# Patient Record
Sex: Male | Born: 1975 | Race: White | Hispanic: No | Smoking: Former smoker
Health system: Southern US, Community
[De-identification: ages and names within clinical notes are randomized; demographics above are authoritative.]

## PROBLEM LIST (undated history)

## (undated) DIAGNOSIS — M1990 Unspecified osteoarthritis, unspecified site: Secondary | ICD-10-CM

## (undated) DIAGNOSIS — R112 Nausea with vomiting, unspecified: Secondary | ICD-10-CM

## (undated) DIAGNOSIS — Z9889 Other specified postprocedural states: Secondary | ICD-10-CM

## (undated) DIAGNOSIS — S42022K Displaced fracture of shaft of left clavicle, subsequent encounter for fracture with nonunion: Secondary | ICD-10-CM

## (undated) HISTORY — PX: HAND SURGERY: SHX662

## (undated) HISTORY — PX: SHOULDER SURGERY: SHX246

---

## 2001-09-22 ENCOUNTER — Ambulatory Visit (HOSPITAL_COMMUNITY): Admission: RE | Admit: 2001-09-22 | Discharge: 2001-09-22 | Payer: Self-pay | Admitting: Orthopedic Surgery

## 2001-09-22 ENCOUNTER — Encounter: Payer: Self-pay | Admitting: Orthopedic Surgery

## 2001-12-03 ENCOUNTER — Emergency Department (HOSPITAL_COMMUNITY): Admission: EM | Admit: 2001-12-03 | Discharge: 2001-12-03 | Payer: Self-pay | Admitting: *Deleted

## 2011-01-03 ENCOUNTER — Other Ambulatory Visit: Payer: Self-pay | Admitting: Orthopedic Surgery

## 2011-01-03 DIAGNOSIS — M25551 Pain in right hip: Secondary | ICD-10-CM

## 2011-01-03 DIAGNOSIS — M545 Low back pain: Secondary | ICD-10-CM

## 2011-01-08 ENCOUNTER — Other Ambulatory Visit: Payer: Self-pay

## 2011-01-09 ENCOUNTER — Other Ambulatory Visit: Payer: Self-pay

## 2011-06-10 ENCOUNTER — Emergency Department (INDEPENDENT_AMBULATORY_CARE_PROVIDER_SITE_OTHER)
Admission: EM | Admit: 2011-06-10 | Discharge: 2011-06-10 | Disposition: A | Discharge status: Home / Self Care | Payer: BC Managed Care – PPO | Source: Home / Self Care | Attending: Emergency Medicine | Admitting: Emergency Medicine

## 2011-06-10 DIAGNOSIS — S61419A Laceration without foreign body of unspecified hand, initial encounter: Secondary | ICD-10-CM

## 2011-06-10 DIAGNOSIS — IMO0002 Reserved for concepts with insufficient information to code with codable children: Secondary | ICD-10-CM

## 2011-06-10 DIAGNOSIS — Z23 Encounter for immunization: Secondary | ICD-10-CM

## 2011-06-10 DIAGNOSIS — T148XXA Other injury of unspecified body region, initial encounter: Secondary | ICD-10-CM

## 2011-06-10 DIAGNOSIS — S61409A Unspecified open wound of unspecified hand, initial encounter: Secondary | ICD-10-CM

## 2011-06-10 MED ORDER — HYDROCODONE-ACETAMINOPHEN 5-325 MG PO TABS
1.0000 | ORAL_TABLET | Freq: Once | ORAL | Status: AC
  Administered 2011-06-10: 1 via ORAL

## 2011-06-10 MED ORDER — HYDROCODONE-ACETAMINOPHEN 5-325 MG PO TABS
ORAL_TABLET | ORAL | Status: AC
  Filled 2011-06-10: qty 1

## 2011-06-10 MED ORDER — TETANUS-DIPHTH-ACELL PERTUSSIS 5-2.5-18.5 LF-MCG/0.5 IM SUSP
INTRAMUSCULAR | Status: AC
  Filled 2011-06-10: qty 0.5

## 2011-06-10 MED ORDER — OXYCODONE-ACETAMINOPHEN 5-325 MG PO TABS: ORAL_TABLET | ORAL | Status: AC

## 2011-06-10 MED ORDER — TETANUS-DIPHTH-ACELL PERTUSSIS 5-2.5-18.5 LF-MCG/0.5 IM SUSP
0.5000 mL | Freq: Once | INTRAMUSCULAR | Status: AC
  Administered 2011-06-10: 0.5 mL via INTRAMUSCULAR

## 2011-06-10 MED ORDER — CEPHALEXIN 500 MG PO CAPS: 500.0000 mg | ORAL_CAPSULE | Freq: Three times a day (TID) | ORAL | Status: AC

## 2011-06-10 NOTE — ED Provider Notes (Signed)
History     CSN: [id number]  Arrival date & time 06/10/11  4403   First MD Initiated Contact with Patient 06/10/11 1837      Chief Complaint  Patient presents with  . Extremity Laceration    (Consider location/radiation/quality/duration/timing/severity/associated sxs/prior treatment) HPI Comments: Geoffrey Perez is a 36 year old male who lacerated the dorsum of his right hand this evening with a band saw. He is unable to extend the third and fourth fingers of the hand. He denies any numbness or tingling. His last tetanus vaccine was 5 years ago.   History reviewed. No pertinent past medical history.  History reviewed. No pertinent past surgical history.  History reviewed. No pertinent family history.  History  Substance Use Topics  . Smoking status: Current Everyday Smoker -- 1.0 packs/day  . Smokeless tobacco: Not on file  . Alcohol Use: No      Review of Systems  Musculoskeletal: Positive for arthralgias. Negative for myalgias, back pain, joint swelling and gait problem.  Skin: Negative for rash and wound.  Neurological: Negative for weakness and numbness.    Allergies  Review of patient's allergies indicates no known allergies.  Home Medications   Current Outpatient Rx  Name Route Sig Dispense Refill  . CEPHALEXIN 500 MG PO CAPS Oral Take 1 capsule (500 mg total) by mouth 3 (three) times daily. 30 capsule 0  . OXYCODONE-ACETAMINOPHEN 5-325 MG PO TABS  1 to 2 tablets every 6 hours as needed for pain. 20 tablet 0    BP 116/76  Pulse 75  Temp(Src) 98.8 F (37.1 C) (Oral)  Resp 18  SpO2 99%  Physical Exam  Nursing note and vitals reviewed. Constitutional: He is oriented to person, place, and time. He appears well-developed and well-nourished. No distress.  Musculoskeletal: He exhibits tenderness. He exhibits no edema.       There is a 5 cm laceration across the dorsum of the right hand. This appears to have severed the extensor tendons of the police the or and  fourth fingers and possibly others as well. Is no visible contamination or foreign bodies present. He's unable to extend the fourth and third fingers of the hand.  Neurological: He is alert and oriented to person, place, and time. He has normal strength and normal reflexes. He displays no atrophy. No sensory deficit. He exhibits normal muscle tone.  Skin: Skin is warm and dry. No rash noted. He is not diaphoretic.    ED Course  LACERATION REPAIR Date/Time: 06/10/2011 8:40 PM Performed by: Roque Lias Authorized by: Roque Lias Consent: Verbal consent obtained. Written consent not obtained. Risks and benefits: risks, benefits and alternatives were discussed Consent given by: patient Patient understanding: patient states understanding of the procedure being performed Patient identity confirmed: verbally with patient and arm band Body area: upper extremity Location details: right hand Laceration length: 5 cm Foreign bodies: no foreign bodies Tendon involvement: complex Nerve involvement: none Vascular damage: no Anesthesia: local infiltration Local anesthetic: lidocaine 2% without epinephrine Anesthetic total: 10 ml Patient sedated: no Preparation: Patient was prepped and draped in the usual sterile fashion. Irrigation solution: saline Irrigation method: syringe Amount of cleaning: extensive Debridement: minimal Degree of undermining: none Skin closure: 4-0 nylon Number of sutures: 7 Technique: simple Approximation: loose Approximation difficulty: simple Dressing: 4x4 sterile gauze, antibiotic ointment and gauze roll Patient tolerance: Patient tolerated the procedure well with no immediate complications.   (including critical care time)  Labs Reviewed - No data to display No results found.  1. Hand laceration   2. Tendon laceration       MDM  I called Dr. Dairl Ponder about this situation. Dr. Mina Marble wanted me to close the wound loosely and  have him come to his office tomorrow morning for probable tendon repair later on in the week.        Roque Lias, MD 06/10/11 [phone]

## 2011-06-10 NOTE — ED Notes (Signed)
Pt states he is sure tetanus has been less than 10 years and possibly 5 but he is uncertain.

## 2011-06-10 NOTE — ED Notes (Signed)
Pt states he cut the back of his rt hand using a band saw approximately 20 minutes PTA.  States area is burning and stinging.

## 2017-11-13 ENCOUNTER — Encounter (HOSPITAL_BASED_OUTPATIENT_CLINIC_OR_DEPARTMENT_OTHER): Payer: Self-pay | Admitting: *Deleted

## 2017-11-15 ENCOUNTER — Encounter (HOSPITAL_BASED_OUTPATIENT_CLINIC_OR_DEPARTMENT_OTHER): Payer: Self-pay | Admitting: Physician Assistant

## 2017-11-15 DIAGNOSIS — S42022K Displaced fracture of shaft of left clavicle, subsequent encounter for fracture with nonunion: Secondary | ICD-10-CM

## 2017-11-15 HISTORY — DX: Displaced fracture of shaft of left clavicle, subsequent encounter for fracture with nonunion: S42.022K
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
## 2017-11-18 ENCOUNTER — Encounter (HOSPITAL_BASED_OUTPATIENT_CLINIC_OR_DEPARTMENT_OTHER): Payer: Self-pay | Admitting: Anesthesiology

## 2017-11-18 ENCOUNTER — Ambulatory Visit (HOSPITAL_BASED_OUTPATIENT_CLINIC_OR_DEPARTMENT_OTHER): Payer: BLUE CROSS/BLUE SHIELD | Admitting: Anesthesiology

## 2017-11-18 ENCOUNTER — Ambulatory Visit (HOSPITAL_BASED_OUTPATIENT_CLINIC_OR_DEPARTMENT_OTHER)
Admission: RE | Admit: 2017-11-18 | Discharge: 2017-11-18 | Disposition: A | Discharge status: Home / Self Care | Payer: BLUE CROSS/BLUE SHIELD | Source: Ambulatory Visit | Attending: Orthopedic Surgery | Admitting: Orthopedic Surgery

## 2017-11-18 ENCOUNTER — Encounter (HOSPITAL_BASED_OUTPATIENT_CLINIC_OR_DEPARTMENT_OTHER)
Admission: RE | Disposition: A | Discharge status: Home / Self Care | Payer: Self-pay | Source: Ambulatory Visit | Attending: Orthopedic Surgery

## 2017-11-18 DIAGNOSIS — S42022A Displaced fracture of shaft of left clavicle, initial encounter for closed fracture: Secondary | ICD-10-CM | POA: Diagnosis present

## 2017-11-18 DIAGNOSIS — Z8781 Personal history of (healed) traumatic fracture: Secondary | ICD-10-CM | POA: Diagnosis not present

## 2017-11-18 DIAGNOSIS — W19XXXA Unspecified fall, initial encounter: Secondary | ICD-10-CM | POA: Diagnosis not present

## 2017-11-18 DIAGNOSIS — Z87891 Personal history of nicotine dependence: Secondary | ICD-10-CM | POA: Insufficient documentation

## 2017-11-18 DIAGNOSIS — S42022K Displaced fracture of shaft of left clavicle, subsequent encounter for fracture with nonunion: Secondary | ICD-10-CM

## 2017-11-18 HISTORY — PX: ORIF CLAVICULAR FRACTURE: SHX5055

## 2017-11-18 HISTORY — DX: Other specified postprocedural states: Z98.890

## 2017-11-18 HISTORY — DX: Other specified postprocedural states: R11.2

## 2017-11-18 HISTORY — PX: HARDWARE REMOVAL: SHX979

## 2017-11-18 HISTORY — DX: Displaced fracture of shaft of left clavicle, subsequent encounter for fracture with nonunion: S42.022K

## 2017-11-18 SURGERY — OPEN REDUCTION INTERNAL FIXATION (ORIF) CLAVICULAR FRACTURE
Anesthesia: General | Site: Shoulder | Laterality: Left

## 2017-11-18 MED ORDER — PROMETHAZINE HCL 25 MG/ML IJ SOLN: 6.2500 mg | INTRAMUSCULAR | Status: DC | PRN

## 2017-11-18 MED ORDER — FENTANYL CITRATE (PF) 100 MCG/2ML IJ SOLN
INTRAMUSCULAR | Status: AC
  Filled 2017-11-18: qty 2

## 2017-11-18 MED ORDER — ONDANSETRON HCL 4 MG/2ML IJ SOLN
INTRAMUSCULAR | Status: AC
Start: 2017-11-18 — End: ?
  Filled 2017-11-18: qty 2

## 2017-11-18 MED ORDER — OXYCODONE HCL 5 MG PO TABS: ORAL_TABLET | ORAL | 0 refills | Status: DC

## 2017-11-18 MED ORDER — DEXAMETHASONE SODIUM PHOSPHATE 10 MG/ML IJ SOLN
INTRAMUSCULAR | Status: AC
Start: 2017-11-18 — End: ?
  Filled 2017-11-18: qty 1

## 2017-11-18 MED ORDER — KETOROLAC TROMETHAMINE 30 MG/ML IJ SOLN
INTRAMUSCULAR | Status: AC
  Filled 2017-11-18: qty 1

## 2017-11-18 MED ORDER — LACTATED RINGERS IV SOLN
INTRAVENOUS | Status: DC
  Administered 2017-11-18 (×2): via INTRAVENOUS

## 2017-11-18 MED ORDER — HYDROMORPHONE HCL 1 MG/ML IJ SOLN
INTRAMUSCULAR | Status: AC
  Filled 2017-11-18: qty 0.5

## 2017-11-18 MED ORDER — SUGAMMADEX SODIUM 200 MG/2ML IV SOLN
INTRAVENOUS | Status: AC
  Filled 2017-11-18: qty 2

## 2017-11-18 MED ORDER — POVIDONE-IODINE 7.5 % EX SOLN: Freq: Once | CUTANEOUS | Status: DC

## 2017-11-18 MED ORDER — VITAMIN K2 100 MCG PO CAPS: 1.0000 | ORAL_CAPSULE | Freq: Every day | ORAL | 0 refills | Status: DC

## 2017-11-18 MED ORDER — ROCURONIUM BROMIDE 10 MG/ML (PF) SYRINGE
PREFILLED_SYRINGE | INTRAVENOUS | Status: AC
Start: 2017-11-18 — End: ?
  Filled 2017-11-18: qty 10

## 2017-11-18 MED ORDER — SCOPOLAMINE 1 MG/3DAYS TD PT72
MEDICATED_PATCH | TRANSDERMAL | Status: AC
  Filled 2017-11-18: qty 1

## 2017-11-18 MED ORDER — VITAMIN D3 125 MCG (5000 UT) PO TABS: 1.0000 | ORAL_TABLET | Freq: Every day | ORAL | 1 refills | Status: AC

## 2017-11-18 MED ORDER — HYDROMORPHONE HCL 1 MG/ML IJ SOLN
0.2500 mg | INTRAMUSCULAR | Status: DC | PRN
  Administered 2017-11-18 (×3): 0.5 mg via INTRAVENOUS

## 2017-11-18 MED ORDER — SCOPOLAMINE 1 MG/3DAYS TD PT72
1.0000 | MEDICATED_PATCH | Freq: Once | TRANSDERMAL | Status: DC | PRN
  Administered 2017-11-18: 1.5 mg via TRANSDERMAL

## 2017-11-18 MED ORDER — LIDOCAINE HCL (CARDIAC) PF 100 MG/5ML IV SOSY
PREFILLED_SYRINGE | INTRAVENOUS | Status: AC
Start: 2017-11-18 — End: ?
  Filled 2017-11-18: qty 5

## 2017-11-18 MED ORDER — LIDOCAINE HCL (CARDIAC) PF 100 MG/5ML IV SOSY
PREFILLED_SYRINGE | INTRAVENOUS | Status: DC | PRN
  Administered 2017-11-18: 100 mg via INTRAVENOUS

## 2017-11-18 MED ORDER — CHLORHEXIDINE GLUCONATE 4 % EX LIQD: 60.0000 mL | Freq: Once | CUTANEOUS | Status: DC

## 2017-11-18 MED ORDER — FENTANYL CITRATE (PF) 100 MCG/2ML IJ SOLN
50.0000 ug | INTRAMUSCULAR | Status: AC | PRN
  Administered 2017-11-18 (×6): 50 ug via INTRAVENOUS

## 2017-11-18 MED ORDER — KETOROLAC TROMETHAMINE 30 MG/ML IJ SOLN
30.0000 mg | Freq: Once | INTRAMUSCULAR | Status: AC | PRN
  Administered 2017-11-18: 30 mg via INTRAVENOUS

## 2017-11-18 MED ORDER — HYDROMORPHONE HCL 1 MG/ML IJ SOLN
INTRAMUSCULAR | Status: AC
Start: 2017-11-18 — End: ?
  Filled 2017-11-18: qty 0.5

## 2017-11-18 MED ORDER — SUGAMMADEX SODIUM 200 MG/2ML IV SOLN
INTRAVENOUS | Status: DC | PRN
  Administered 2017-11-18: 200 mg via INTRAVENOUS

## 2017-11-18 MED ORDER — CEFAZOLIN SODIUM-DEXTROSE 2-4 GM/100ML-% IV SOLN
INTRAVENOUS | Status: AC
  Filled 2017-11-18: qty 100

## 2017-11-18 MED ORDER — LACTATED RINGERS IV SOLN
INTRAVENOUS | Status: DC
  Administered 2017-11-18 (×2): via INTRAVENOUS

## 2017-11-18 MED ORDER — FENTANYL CITRATE (PF) 100 MCG/2ML IJ SOLN
25.0000 ug | INTRAMUSCULAR | Status: DC | PRN
  Administered 2017-11-18 (×2): 50 ug via INTRAVENOUS

## 2017-11-18 MED ORDER — MIDAZOLAM HCL 2 MG/2ML IJ SOLN
1.0000 mg | INTRAMUSCULAR | Status: DC | PRN
  Administered 2017-11-18 (×2): 1 mg via INTRAVENOUS

## 2017-11-18 MED ORDER — MIDAZOLAM HCL 2 MG/2ML IJ SOLN
INTRAMUSCULAR | Status: AC
  Filled 2017-11-18: qty 2

## 2017-11-18 MED ORDER — ROCURONIUM BROMIDE 100 MG/10ML IV SOLN
INTRAVENOUS | Status: DC | PRN
  Administered 2017-11-18: 50 mg via INTRAVENOUS

## 2017-11-18 MED ORDER — ONDANSETRON HCL 4 MG/2ML IJ SOLN
INTRAMUSCULAR | Status: DC | PRN
  Administered 2017-11-18: 4 mg via INTRAVENOUS

## 2017-11-18 MED ORDER — CEFAZOLIN SODIUM-DEXTROSE 2-4 GM/100ML-% IV SOLN
2.0000 g | INTRAVENOUS | Status: AC
  Administered 2017-11-18: 2 g via INTRAVENOUS

## 2017-11-18 MED ORDER — CYCLOBENZAPRINE HCL 5 MG PO TABS: 5.0000 mg | ORAL_TABLET | Freq: Three times a day (TID) | ORAL | 0 refills | Status: DC | PRN

## 2017-11-18 MED ORDER — OXYCODONE HCL 5 MG PO TABS: 5.0000 mg | ORAL_TABLET | Freq: Once | ORAL | Status: DC | PRN

## 2017-11-18 MED ORDER — OXYCODONE HCL 5 MG/5ML PO SOLN: 5.0000 mg | Freq: Once | ORAL | Status: DC | PRN

## 2017-11-18 MED ORDER — PROPOFOL 10 MG/ML IV BOLUS
INTRAVENOUS | Status: DC | PRN
  Administered 2017-11-18: 200 mg via INTRAVENOUS

## 2017-11-18 SURGICAL SUPPLY — 93 items
BAG DECANTER FOR FLEXI CONT (MISCELLANEOUS) IMPLANT
BANDAGE ACE 4X5 VEL STRL LF (GAUZE/BANDAGES/DRESSINGS) IMPLANT
BANDAGE ACE 6X5 VEL STRL LF (GAUZE/BANDAGES/DRESSINGS) IMPLANT
BENZOIN TINCTURE PRP APPL 2/3 (GAUZE/BANDAGES/DRESSINGS) ×3 IMPLANT
BIT DRILL 2.8X5 QR DISP (BIT) ×3 IMPLANT
BLADE HEX COATED 2.75 (ELECTRODE) ×3 IMPLANT
BLADE SURG 15 STRL LF DISP TIS (BLADE) ×4 IMPLANT
BLADE SURG 15 STRL SS (BLADE) ×8
BNDG COHESIVE 4X5 TAN STRL (GAUZE/BANDAGES/DRESSINGS) IMPLANT
BNDG ESMARK 4X9 LF (GAUZE/BANDAGES/DRESSINGS) IMPLANT
CANISTER SUCT 1200ML W/VALVE (MISCELLANEOUS) ×3 IMPLANT
CLOSURE WOUND 1/2 X4 (GAUZE/BANDAGES/DRESSINGS) ×1
COVER BACK TABLE 60X90IN (DRAPES) ×3 IMPLANT
CUFF TOURNIQUET SINGLE 18IN (TOURNIQUET CUFF) IMPLANT
CUFF TOURNIQUET SINGLE 34IN LL (TOURNIQUET CUFF) IMPLANT
DECANTER SPIKE VIAL GLASS SM (MISCELLANEOUS) IMPLANT
DRAPE EXTREMITY T 121X128X90 (DRAPE) ×3 IMPLANT
DRAPE IMP U-DRAPE 54X76 (DRAPES) ×3 IMPLANT
DRAPE INCISE IOBAN 66X45 STRL (DRAPES) IMPLANT
DRAPE OEC MINIVIEW 54X84 (DRAPES) ×3 IMPLANT
DRAPE SURG 17X23 STRL (DRAPES) IMPLANT
DRAPE U-SHAPE 47X51 STRL (DRAPES) ×6 IMPLANT
DRAPE U-SHAPE 76X120 STRL (DRAPES) ×6 IMPLANT
DRSG AQUACEL AG ADV 3.5X10 (GAUZE/BANDAGES/DRESSINGS) ×3 IMPLANT
DRSG MEPILEX BORDER 4X8 (GAUZE/BANDAGES/DRESSINGS) IMPLANT
DRSG PAD ABDOMINAL 8X10 ST (GAUZE/BANDAGES/DRESSINGS) ×3 IMPLANT
DURAPREP 26ML APPLICATOR (WOUND CARE) ×3 IMPLANT
ELECT REM PT RETURN 9FT ADLT (ELECTROSURGICAL) ×3
ELECTRODE REM PT RTRN 9FT ADLT (ELECTROSURGICAL) ×1 IMPLANT
GAUZE SPONGE 4X4 12PLY STRL (GAUZE/BANDAGES/DRESSINGS) ×3 IMPLANT
GAUZE SPONGE 4X4 16PLY XRAY LF (GAUZE/BANDAGES/DRESSINGS) IMPLANT
GAUZE XEROFORM 1X8 LF (GAUZE/BANDAGES/DRESSINGS) IMPLANT
GLOVE BIO SURGEON STRL SZ7 (GLOVE) ×6 IMPLANT
GLOVE BIOGEL PI IND STRL 7.0 (GLOVE) ×2 IMPLANT
GLOVE BIOGEL PI IND STRL 7.5 (GLOVE) ×1 IMPLANT
GLOVE BIOGEL PI INDICATOR 7.0 (GLOVE) ×4
GLOVE BIOGEL PI INDICATOR 7.5 (GLOVE) ×2
GLOVE SS BIOGEL STRL SZ 7.5 (GLOVE) ×1 IMPLANT
GLOVE SUPERSENSE BIOGEL SZ 7.5 (GLOVE) ×2
GOWN STRL REUS W/ TWL LRG LVL3 (GOWN DISPOSABLE) ×2 IMPLANT
GOWN STRL REUS W/ TWL XL LVL3 (GOWN DISPOSABLE) ×1 IMPLANT
GOWN STRL REUS W/TWL LRG LVL3 (GOWN DISPOSABLE) ×4
GOWN STRL REUS W/TWL XL LVL3 (GOWN DISPOSABLE) ×2
NEEDLE HYPO 22GX1.5 SAFETY (NEEDLE) IMPLANT
NS IRRIG 1000ML POUR BTL (IV SOLUTION) ×3 IMPLANT
PACK ARTHROSCOPY DSU (CUSTOM PROCEDURE TRAY) ×3 IMPLANT
PACK BASIN DAY SURGERY FS (CUSTOM PROCEDURE TRAY) ×3 IMPLANT
PAD CAST 3X4 CTTN HI CHSV (CAST SUPPLIES) IMPLANT
PAD CAST 4YDX4 CTTN HI CHSV (CAST SUPPLIES) ×1 IMPLANT
PADDING CAST ABS 4INX4YD NS (CAST SUPPLIES)
PADDING CAST ABS COTTON 4X4 ST (CAST SUPPLIES) IMPLANT
PADDING CAST COTTON 3X4 STRL (CAST SUPPLIES)
PADDING CAST COTTON 4X4 STRL (CAST SUPPLIES) ×2
PENCIL BUTTON HOLSTER BLD 10FT (ELECTRODE) ×3 IMPLANT
PLATE CLAVICLE 10 HOLE (Plate) ×3 IMPLANT
SCREW HEXALOBE LOCKING 3.5X16M (Screw) ×3 IMPLANT
SCREW HEXALOBE NON-LOCK 3.5X14 (Screw) ×6 IMPLANT
SCREW LOCK 12X3.5X HEXALOBE (Screw) ×2 IMPLANT
SCREW LOCK 18X3.5X HEXALOBE (Screw) ×1 IMPLANT
SCREW LOCKING 3.5X12 (Screw) ×4 IMPLANT
SCREW LOCKING 3.5X18MM (Screw) ×2 IMPLANT
SCREW NON LOCKING HEX 3.5X18MM (Screw) ×3 IMPLANT
SHEET MEDIUM DRAPE 40X70 STRL (DRAPES) IMPLANT
SLEEVE SCD COMPRESS KNEE MED (MISCELLANEOUS) IMPLANT
SLING ARM FOAM STRAP LRG (SOFTGOODS) IMPLANT
SLING ARM IMMOBILIZER LRG (SOFTGOODS) IMPLANT
SLING ARM IMMOBILIZER MED (SOFTGOODS) IMPLANT
SLING ARM XL FOAM STRAP (SOFTGOODS) IMPLANT
SPONGE LAP 18X18 RF (DISPOSABLE) ×6 IMPLANT
SPONGE LAP 4X18 RFD (DISPOSABLE) ×6 IMPLANT
STOCKINETTE 6  STRL (DRAPES)
STOCKINETTE 6 STRL (DRAPES) IMPLANT
STRIP CLOSURE SKIN 1/2X4 (GAUZE/BANDAGES/DRESSINGS) ×2 IMPLANT
SUCTION FRAZIER HANDLE 10FR (MISCELLANEOUS)
SUCTION TUBE FRAZIER 10FR DISP (MISCELLANEOUS) IMPLANT
SUT ETHILON 4 0 PS 2 18 (SUTURE) IMPLANT
SUT MNCRL AB 3-0 PS2 18 (SUTURE) ×3 IMPLANT
SUT PROLENE 3 0 PS 2 (SUTURE) IMPLANT
SUT SILK 4 0 TIES 17X18 (SUTURE) IMPLANT
SUT VIC AB 0 CT1 27 (SUTURE)
SUT VIC AB 0 CT1 27XBRD ANBCTR (SUTURE) IMPLANT
SUT VIC AB 2-0 PS2 27 (SUTURE) ×3 IMPLANT
SUT VIC AB 2-0 SH 27 (SUTURE) ×2
SUT VIC AB 2-0 SH 27XBRD (SUTURE) ×1 IMPLANT
SUT VIC AB 3-0 FS2 27 (SUTURE) IMPLANT
SUT VICRYL 0 UR6 27IN ABS (SUTURE) ×3 IMPLANT
SUT VICRYL 4-0 PS2 18IN ABS (SUTURE) IMPLANT
SYR BULB 3OZ (MISCELLANEOUS) ×3 IMPLANT
SYR CONTROL 10ML LL (SYRINGE) IMPLANT
TUBE CONNECTING 20'X1/4 (TUBING)
TUBE CONNECTING 20X1/4 (TUBING) IMPLANT
UNDERPAD 30X30 (UNDERPADS AND DIAPERS) IMPLANT
YANKAUER SUCT BULB TIP NO VENT (SUCTIONS) ×3 IMPLANT

## 2017-11-18 NOTE — Anesthesia Procedure Notes (Signed)
Procedure Name: Intubation Date/Time: 11/18/2017 12:08 PM Performed by: Gar GibbonKeeton, Leshawn Straka S, CRNA Pre-anesthesia Checklist: Patient identified, Emergency Drugs available, Suction available and Patient being monitored Patient Re-evaluated:Patient Re-evaluated prior to induction Oxygen Delivery Method: Circle system utilized Preoxygenation: Pre-oxygenation with 100% oxygen Induction Type: IV induction Ventilation: Mask ventilation without difficulty Laryngoscope Size: Miller and 3 Grade View: Grade I Tube type: Oral Tube size: 8.0 mm Number of attempts: 1 Airway Equipment and Method: Stylet and Oral airway Placement Confirmation: ETT inserted through vocal cords under direct vision,  positive ETCO2 and breath sounds checked- equal and bilateral Secured at: 21 cm Tube secured with: Tape Dental Injury: Teeth and Oropharynx as per pre-operative assessment

## 2017-11-18 NOTE — Transfer of Care (Signed)
Immediate Anesthesia Transfer of Care Note  Patient: Geoffrey Perez  Procedure(s) Performed: OPEN REDUCTION INTERNAL FIXATION (ORIF) LEFT CLAVICULAR FRACTURE (Left Shoulder) LEFT CLAVICAL HARDWARE REMOVAL (Left Shoulder)  Patient Location: PACU  Anesthesia Type:General  Level of Consciousness: awake, sedated and confused  Airway & Oxygen Therapy: Patient Spontanous Breathing and Patient connected to face mask oxygen  Post-op Assessment: Report given to RN and Post -op Vital signs reviewed and stable  Post vital signs: Reviewed and stable  Last Vitals:  Vitals Value Taken Time  BP 136/114 11/18/2017  2:32 PM  Temp    Pulse 82 11/18/2017  2:33 PM  Resp 14 11/18/2017  2:33 PM  SpO2 100 % 11/18/2017  2:33 PM  Vitals shown include unvalidated device data.  Last Pain:  Vitals:   11/18/17 1015  TempSrc: Oral  PainSc: 2          Complications: No apparent anesthesia complications

## 2017-11-18 NOTE — Anesthesia Postprocedure Evaluation (Signed)
Anesthesia Post Note  Patient: [name]  Procedure(s) Performed: OPEN REDUCTION INTERNAL FIXATION (ORIF) LEFT CLAVICULAR FRACTURE (Left Shoulder) LEFT CLAVICAL HARDWARE REMOVAL (Left Shoulder)     Patient location during evaluation: PACU Anesthesia Type: General Level of consciousness: awake and alert Pain management: pain level controlled Vital Signs Assessment: post-procedure vital signs reviewed and stable Respiratory status: spontaneous breathing, nonlabored ventilation and respiratory function stable Cardiovascular status: blood pressure returned to baseline and stable Postop Assessment: no apparent nausea or vomiting Anesthetic complications: no    Last Vitals:  Vitals:   11/18/17 1545 11/18/17 1600  BP: 118/84 (!) 127/96  Pulse: 90 (!) 49  Resp: 19 15  Temp:    SpO2: 95% 95%    Last Pain:  Vitals:   11/18/17 1600  TempSrc:   PainSc: 6                  Beryle Lathehomas E Brock

## 2017-11-18 NOTE — Anesthesia Preprocedure Evaluation (Addendum)
Anesthesia Evaluation  Patient identified by MRN, date of birth, ID band Patient awake    Reviewed: Allergy & Precautions, NPO status , Patient's Chart, lab work & pertinent test results  History of Anesthesia Complications (+) PONV  Airway Mallampati: II  TM Distance: >3 FB Neck ROM: Full    Dental  (+) Dental Advisory Given,    Pulmonary former smoker,    breath sounds clear to auscultation       Cardiovascular negative cardio ROS   Rhythm:Regular Rate:Normal     Neuro/Psych negative neurological ROS  negative psych ROS   GI/Hepatic negative GI ROS, Neg liver ROS,   Endo/Other  negative endocrine ROS  Renal/GU negative Renal ROS  negative genitourinary   Musculoskeletal negative musculoskeletal ROS (+)   Abdominal   Peds  Hematology negative hematology ROS (+)   Anesthesia Other Findings   Reproductive/Obstetrics                            Anesthesia Physical Anesthesia Plan  ASA: I  Anesthesia Plan: General   Post-op Pain Management:    Induction: Intravenous  PONV Risk Score and Plan: 4 or greater and Treatment may vary due to age or medical condition, Ondansetron, Dexamethasone, Scopolamine patch - Pre-op and Midazolam  Airway Management Planned: Oral ETT  Additional Equipment: None  Intra-op Plan:   Post-operative Plan: Extubation in OR  Informed Consent: I have reviewed the patients History and Physical, chart, labs and discussed the procedure including the risks, benefits and alternatives for the proposed anesthesia with the patient or authorized representative who has indicated his/her understanding and acceptance.   Dental advisory given  Plan Discussed with: CRNA and Anesthesiologist  Anesthesia Plan Comments:         Anesthesia Quick Evaluation

## 2017-11-18 NOTE — Discharge Instructions (Addendum)
Discharge Instructions            CLAVICLE FRACTURE AFTER CARE INSTRUCTIONS  Refer to this sheet in the next few weeks. These discharge instructions provide you with general information on caring for yourself after you leave the hospital. Your caregiver may also give you specific instructions. Your treatment has been planned according to the most current medical practices available, but unavoidable complications sometimes occur. If you have any problems or questions after discharge, please call your caregiver. HOME INSTRUCTIONS You may resume a normal diet and activities as directed.  EXCEPT YOU MAY NOT USE YOUR LEFT ARM AT ALL Take showers instead of baths until informed otherwise.  Do not remove the dressing over the surgical site.  Dressing is waterproof.  OK to shower.   Only take over-the-counter or prescription medicines for pain, discomfort, or fever as directed by your caregiver. DO NOT TAKE ANY ANTI-INFLAMMATORIES.  (NO ADVIL, IBUPROFEN, ALEVE, NAPROSYN) Wear your sling for the next 6 weeks unless otherwise instructed. Eat a well-balanced diet.  Avoid lifting or driving until you are instructed otherwise.  Make an appointment to see your caregiver for stitches (suture) or staple removal as directed.   SEEK MEDICAL CARE IF: You have swelling of your calf or leg.  You develop shortness of breath or chest pain.  You have redness, swelling, or increasing pain in the wound.  There is pus or any unusual drainage coming from the surgical site.  You notice a bad smell coming from the surgical site or dressing.  The surgical site breaks open after sutures or staples have been removed.  There is persistent bleeding from the suture or staple line.  You are getting worse or are not improving.  You have any other questions or concerns.  SEEK IMMEDIATE MEDICAL CARE IF:  You have a fever greater than 101 You develop a rash.  You have difficulty breathing.  You develop any reaction or side  effects to medicines given.  Your knee motion is decreasing rather than improving.  MAKE SURE YOU:  Understand these instructions.  Will watch your condition.  Will get help right away if you are not doing well or get worse.   Increase activity slowly   Complete by:  As directed    Must wear sling at all times except for showering for the next 6 weeks   Must wear sling at all times except for showering for the next 6 weeks      Post Anesthesia Home Care Instructions  Activity: Get plenty of rest for the remainder of the day. A responsible individual must stay with you for 24 hours following the procedure.  For the next 24 hours, DO NOT: -Drive a car -Advertising copywriterperate machinery -Drink alcoholic beverages -Take any medication unless instructed by your physician -Make any legal decisions or sign important papers.  Meals: Start with liquid foods such as gelatin or soup. Progress to regular foods as tolerated. Avoid greasy, spicy, heavy foods. If nausea and/or vomiting occur, drink only clear liquids until the nausea and/or vomiting subsides. Call your physician if vomiting continues.  Special Instructions/Symptoms: Your throat may feel dry or sore from the anesthesia or the breathing tube placed in your throat during surgery. If this causes discomfort, gargle with warm salt water. The discomfort should disappear within 24 hours.  If you had a scopolamine patch placed behind your ear for the management of post- operative nausea and/or vomiting:  1. The medication in the patch is effective for  72 hours, after which it should be removed.  Wrap patch in a tissue and discard in the trash. Wash hands thoroughly with soap and water. 2. You may remove the patch earlier than 72 hours if you experience unpleasant side effects which may include dry mouth, dizziness or visual disturbances. 3. Avoid touching the patch. Wash your hands with soap and water after contact with the patch.

## 2017-11-19 ENCOUNTER — Encounter (HOSPITAL_BASED_OUTPATIENT_CLINIC_OR_DEPARTMENT_OTHER): Payer: Self-pay | Admitting: Orthopedic Surgery

## 2017-11-19 NOTE — Op Note (Signed)
NAME: Geoffrey Perez, [name] MEDICAL RECORD [id number] ACCOUNT [id number] DATE OF BIRTH:Jun 29, 1975 FACILITY: [facility] LOCATION: [facility] PHYSICIAN:Aydyn Testerman Salley SlaughterA. Cora Stetson, MD  OPERATIVE REPORT  DATE OF PROCEDURE:  11/18/2017  PREOPERATIVE DIAGNOSIS:  Left clavicle, acute traumatic refracture after previous open reduction internal fixation of the left clavicle fracture with retained hardware.  POSTOPERATIVE DIAGNOSIS:  Left clavicle, acute traumatic refracture after previous open reduction internal fixation of the left clavicle fracture with retained hardware.  PROCEDURE PERFORMED:  Revision of open reduction internal fixation of left clavicle fracture with hardware removal with 10-hole Acumed medication a plate.  SURGEON:   Elana Almobert A. Thurston HoleWainer, MD  ASSISTANT:  Dr.  Ramond Marrowax Varkey and MondoviKiersten Jefferson, [place].  ANESTHESIA:  General.  OPERATIVE TIME:  One hour and 30 minutes.  SPECIMENS:  None.  INDICATIONS:  The patient is a 42 year old gentleman who initially sustained a left displaced midshaft clavicle fracture 6 weeks ago.  He underwent open reduction internal fixation of this fracture with anatomic reduction and fixation at that time.   Subsequently, after not following medical instructions, he was working on cars and fell and refractured his left clavicle displacing the clavicle plate and is now to undergo revision of his open reduction internal fixation as well as hardware removal of  the previous plate.  DESCRIPTION OF PROCEDURE:  The patient was brought to the operating room on 11/18/2017, placed on the operating table in supine position.  After being placed under general anesthesia, he was placed in a beach chair position.  His left shoulder and chest  and clavicle area was prepped using sterile DuraPrep and draped using sterile technique.  Timeout procedure was called and the correct left clavicle identified.  Initially through his previous incision, the new incision was made and extended 2 cm  medial  and lateral.  Underlying subcutaneous tissues were incised in line with the skin incision.  The fascia over the clavicle plate and fracture was then incised longitudinally.  Neurovascular structures carefully protected.  The plate was found to be loose  and the fracture was displaced again and all of the screws were removed and the plate was removed without complication.  The fracture was then re-reduced after a careful and meticulous reduction and held in place and confirmed with intraoperative  fluoroscopy.  After this was done, a 10-hole Acumed plate was placed on the superior surface of the fracture and each of these screw holes, the 3 medial and the 4 lateral screw holes were sequentially drilled and the appropriate length screws placed for  firm and tight fixation.  There were 2 locking screws placed medially, 2 locking screws placed laterally and the other screws were nonlocking and then the central part of the plate bridged across the fracture site.  Excellent in anatomic and firm  internal reduction was achieved and intraoperative fluoroscopy confirmed this as well.  At this point, the wound was irrigated and then the fascia was closed with a running 2-0 and 0 Vicryl sutures.  Subcutaneous tissues were closed with 2-0 Vicryl,  subcuticular layer closed with 4-0 Monocryl.  Sterile dressings were applied and a sling and then the patient was awakened and taken to recovery room in stable condition.  Needle and sponge count was correct x2 at the end of the case.  FOLLOWUP CARE:  The patient will be followed as an outpatient on oxycodone and Flexeril.  I will see him back in my office in a week for sutures out and followup.  AN/NUANCE  D:11/19/2017 T:11/19/2017 JOB:[id number]

## 2018-10-18 ENCOUNTER — Ambulatory Visit (INDEPENDENT_AMBULATORY_CARE_PROVIDER_SITE_OTHER): Payer: BLUE CROSS/BLUE SHIELD

## 2018-10-18 ENCOUNTER — Ambulatory Visit (HOSPITAL_COMMUNITY)
Admission: EM | Admit: 2018-10-18 | Discharge: 2018-10-18 | Disposition: A | Discharge status: Home / Self Care | Payer: BLUE CROSS/BLUE SHIELD | Attending: Emergency Medicine | Admitting: Emergency Medicine

## 2018-10-18 ENCOUNTER — Encounter (HOSPITAL_COMMUNITY): Payer: Self-pay | Admitting: Emergency Medicine

## 2018-10-18 DIAGNOSIS — S82832A Other fracture of upper and lower end of left fibula, initial encounter for closed fracture: Secondary | ICD-10-CM

## 2018-10-18 MED ORDER — IBUPROFEN 800 MG PO TABS: 800.0000 mg | ORAL_TABLET | Freq: Three times a day (TID) | ORAL | 0 refills | Status: DC

## 2018-10-18 NOTE — Discharge Instructions (Signed)
Follow up with orthopedics Use anti-inflammatories for pain/swelling. You may take up to 800 mg Ibuprofen every 8 hours with food. You may supplement Ibuprofen with Tylenol [phone] mg every 8 hours.   Ice and elevate foot  Use boot and crutches and non weight bearing until cleared by orthopedics
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
## 2018-10-18 NOTE — ED Triage Notes (Signed)
Left ankle pain.  Seen by provider prior to this nurse

## 2018-10-20 ENCOUNTER — Encounter (HOSPITAL_BASED_OUTPATIENT_CLINIC_OR_DEPARTMENT_OTHER): Payer: Self-pay | Admitting: Physician Assistant

## 2018-10-20 ENCOUNTER — Encounter (HOSPITAL_BASED_OUTPATIENT_CLINIC_OR_DEPARTMENT_OTHER)
Admission: RE | Admit: 2018-10-20 | Discharge: 2018-10-20 | Disposition: A | Discharge status: Home / Self Care | Payer: BLUE CROSS/BLUE SHIELD | Source: Ambulatory Visit | Attending: Orthopedic Surgery | Admitting: Orthopedic Surgery

## 2018-10-20 ENCOUNTER — Other Ambulatory Visit (HOSPITAL_COMMUNITY)
Admission: RE | Admit: 2018-10-20 | Discharge: 2018-10-20 | Disposition: A | Discharge status: Home / Self Care | Payer: BLUE CROSS/BLUE SHIELD | Source: Ambulatory Visit | Attending: Orthopedic Surgery | Admitting: Orthopedic Surgery

## 2018-10-20 ENCOUNTER — Other Ambulatory Visit: Payer: Self-pay

## 2018-10-20 DIAGNOSIS — Z1159 Encounter for screening for other viral diseases: Secondary | ICD-10-CM | POA: Insufficient documentation

## 2018-10-20 DIAGNOSIS — S82839A Other fracture of upper and lower end of unspecified fibula, initial encounter for closed fracture: Secondary | ICD-10-CM

## 2018-10-20 HISTORY — DX: Other fracture of upper and lower end of unspecified fibula, initial encounter for closed fracture: S82.839A

## 2018-10-20 NOTE — Progress Notes (Signed)
Pt here for labs, called Kirstin for clarification of orders, do not need lab work today.   Ensure pre surgery drink given with instructions to complete by 0400 dos, pt verbalized understanding.
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
## 2018-10-21 LAB — NOVEL CORONAVIRUS, NAA (HOSP ORDER, SEND-OUT TO REF LAB; TAT 18-24 HRS): SARS-CoV-2, NAA: NOT DETECTED

## 2018-10-22 ENCOUNTER — Ambulatory Visit (HOSPITAL_BASED_OUTPATIENT_CLINIC_OR_DEPARTMENT_OTHER)
Admission: RE | Admit: 2018-10-22 | Discharge: 2018-10-22 | Disposition: A | Discharge status: Home / Self Care | Payer: BLUE CROSS/BLUE SHIELD | Attending: Orthopedic Surgery | Admitting: Orthopedic Surgery

## 2018-10-22 ENCOUNTER — Ambulatory Visit (HOSPITAL_BASED_OUTPATIENT_CLINIC_OR_DEPARTMENT_OTHER): Payer: BLUE CROSS/BLUE SHIELD | Admitting: Certified Registered"

## 2018-10-22 ENCOUNTER — Encounter (HOSPITAL_BASED_OUTPATIENT_CLINIC_OR_DEPARTMENT_OTHER)
Admission: RE | Disposition: A | Discharge status: Home / Self Care | Payer: Self-pay | Source: Home / Self Care | Attending: Orthopedic Surgery

## 2018-10-22 ENCOUNTER — Encounter (HOSPITAL_BASED_OUTPATIENT_CLINIC_OR_DEPARTMENT_OTHER): Payer: Self-pay | Admitting: *Deleted

## 2018-10-22 DIAGNOSIS — S82832A Other fracture of upper and lower end of left fibula, initial encounter for closed fracture: Secondary | ICD-10-CM | POA: Insufficient documentation

## 2018-10-22 DIAGNOSIS — M1990 Unspecified osteoarthritis, unspecified site: Secondary | ICD-10-CM | POA: Insufficient documentation

## 2018-10-22 DIAGNOSIS — S82839A Other fracture of upper and lower end of unspecified fibula, initial encounter for closed fracture: Secondary | ICD-10-CM

## 2018-10-22 DIAGNOSIS — Z87891 Personal history of nicotine dependence: Secondary | ICD-10-CM | POA: Diagnosis not present

## 2018-10-22 DIAGNOSIS — S82892A Other fracture of left lower leg, initial encounter for closed fracture: Secondary | ICD-10-CM | POA: Diagnosis present

## 2018-10-22 HISTORY — DX: Unspecified osteoarthritis, unspecified site: M19.90

## 2018-10-22 HISTORY — PX: ORIF FIBULA FRACTURE: SHX5114

## 2018-10-22 SURGERY — OPEN REDUCTION INTERNAL FIXATION (ORIF) FIBULA FRACTURE
Anesthesia: Regional | Site: Ankle | Laterality: Left

## 2018-10-22 MED ORDER — OXYCODONE HCL 5 MG/5ML PO SOLN: 5.0000 mg | Freq: Once | ORAL | Status: DC | PRN

## 2018-10-22 MED ORDER — LACTATED RINGERS IV SOLN: INTRAVENOUS | Status: DC

## 2018-10-22 MED ORDER — PROPOFOL 10 MG/ML IV BOLUS
INTRAVENOUS | Status: DC | PRN
  Administered 2018-10-22: 200 mg via INTRAVENOUS

## 2018-10-22 MED ORDER — BUPIVACAINE HCL (PF) 0.5 % IJ SOLN
INTRAMUSCULAR | Status: AC
  Filled 2018-10-22: qty 30

## 2018-10-22 MED ORDER — DEXAMETHASONE SODIUM PHOSPHATE 10 MG/ML IJ SOLN
INTRAMUSCULAR | Status: DC | PRN
  Administered 2018-10-22: 10 mg via INTRAVENOUS

## 2018-10-22 MED ORDER — ACETAMINOPHEN 500 MG PO TABS: 1000.0000 mg | ORAL_TABLET | Freq: Once | ORAL | Status: DC | PRN

## 2018-10-22 MED ORDER — ACETAMINOPHEN 160 MG/5ML PO SOLN: 1000.0000 mg | Freq: Once | ORAL | Status: DC | PRN

## 2018-10-22 MED ORDER — DEXAMETHASONE SODIUM PHOSPHATE 10 MG/ML IJ SOLN
INTRAMUSCULAR | Status: AC
  Filled 2018-10-22: qty 1

## 2018-10-22 MED ORDER — FENTANYL CITRATE (PF) 100 MCG/2ML IJ SOLN: 25.0000 ug | INTRAMUSCULAR | Status: DC | PRN

## 2018-10-22 MED ORDER — GLYCOPYRROLATE 0.2 MG/ML IJ SOLN
INTRAMUSCULAR | Status: DC | PRN
  Administered 2018-10-22: 0.2 mg via INTRAVENOUS

## 2018-10-22 MED ORDER — MIDAZOLAM HCL 2 MG/2ML IJ SOLN
INTRAMUSCULAR | Status: AC
  Filled 2018-10-22: qty 2

## 2018-10-22 MED ORDER — FENTANYL CITRATE (PF) 100 MCG/2ML IJ SOLN
INTRAMUSCULAR | Status: AC
  Filled 2018-10-22: qty 2

## 2018-10-22 MED ORDER — BUPIVACAINE HCL (PF) 0.25 % IJ SOLN
INTRAMUSCULAR | Status: AC
  Filled 2018-10-22: qty 30

## 2018-10-22 MED ORDER — MIDAZOLAM HCL 2 MG/2ML IJ SOLN
1.0000 mg | INTRAMUSCULAR | Status: DC | PRN
  Administered 2018-10-22: 2 mg via INTRAVENOUS

## 2018-10-22 MED ORDER — CEFAZOLIN SODIUM-DEXTROSE 2-4 GM/100ML-% IV SOLN: 2.0000 g | INTRAVENOUS | Status: DC

## 2018-10-22 MED ORDER — CYCLOBENZAPRINE HCL 5 MG PO TABS: 5.0000 mg | ORAL_TABLET | Freq: Three times a day (TID) | ORAL | 0 refills | Status: AC | PRN

## 2018-10-22 MED ORDER — POVIDONE-IODINE 10 % EX SWAB: 2.0000 "application " | Freq: Once | CUTANEOUS | Status: DC

## 2018-10-22 MED ORDER — SCOPOLAMINE 1 MG/3DAYS TD PT72
1.0000 | MEDICATED_PATCH | Freq: Once | TRANSDERMAL | Status: DC | PRN
  Administered 2018-10-22: 07:00:00 1.5 mg via TRANSDERMAL

## 2018-10-22 MED ORDER — BUPIVACAINE-EPINEPHRINE (PF) 0.5% -1:200000 IJ SOLN
INTRAMUSCULAR | Status: DC | PRN
  Administered 2018-10-22: 30 mL via PERINEURAL

## 2018-10-22 MED ORDER — SCOPOLAMINE 1 MG/3DAYS TD PT72
MEDICATED_PATCH | TRANSDERMAL | Status: AC
  Filled 2018-10-22: qty 1

## 2018-10-22 MED ORDER — OXYCODONE HCL 5 MG PO TABS: 5.0000 mg | ORAL_TABLET | Freq: Once | ORAL | Status: DC | PRN

## 2018-10-22 MED ORDER — ONDANSETRON HCL 4 MG/2ML IJ SOLN
INTRAMUSCULAR | Status: DC | PRN
  Administered 2018-10-22 (×2): 4 mg via INTRAVENOUS

## 2018-10-22 MED ORDER — ACETAMINOPHEN 10 MG/ML IV SOLN: 1000.0000 mg | Freq: Once | INTRAVENOUS | Status: DC | PRN

## 2018-10-22 MED ORDER — POVIDONE-IODINE 7.5 % EX SOLN: Freq: Once | CUTANEOUS | Status: DC

## 2018-10-22 MED ORDER — ONDANSETRON HCL 4 MG/2ML IJ SOLN
INTRAMUSCULAR | Status: AC
  Filled 2018-10-22: qty 2

## 2018-10-22 MED ORDER — FENTANYL CITRATE (PF) 100 MCG/2ML IJ SOLN
50.0000 ug | INTRAMUSCULAR | Status: DC | PRN
  Administered 2018-10-22: 07:00:00 50 ug via INTRAVENOUS

## 2018-10-22 MED ORDER — OXYCODONE HCL 5 MG PO TABS: 5.0000 mg | ORAL_TABLET | ORAL | 0 refills | Status: AC | PRN

## 2018-10-22 MED ORDER — PROPOFOL 10 MG/ML IV BOLUS
INTRAVENOUS | Status: AC
  Filled 2018-10-22: qty 40

## 2018-10-22 MED ORDER — CEFAZOLIN SODIUM-DEXTROSE 2-3 GM-%(50ML) IV SOLR
INTRAVENOUS | Status: DC | PRN
  Administered 2018-10-22: 2 g via INTRAVENOUS

## 2018-10-22 MED ORDER — CEFAZOLIN SODIUM-DEXTROSE 2-4 GM/100ML-% IV SOLN
INTRAVENOUS | Status: AC
  Filled 2018-10-22: qty 100

## 2018-10-22 MED ORDER — LACTATED RINGERS IV SOLN
INTRAVENOUS | Status: DC
  Administered 2018-10-22: 07:00:00 via INTRAVENOUS

## 2018-10-22 MED ORDER — LIDOCAINE 2% (20 MG/ML) 5 ML SYRINGE
INTRAMUSCULAR | Status: AC
  Filled 2018-10-22: qty 5

## 2018-10-22 SURGICAL SUPPLY — 84 items
BANDAGE ACE 4X5 VEL STRL LF (GAUZE/BANDAGES/DRESSINGS) ×3 IMPLANT
BANDAGE ACE 6X5 VEL STRL LF (GAUZE/BANDAGES/DRESSINGS) ×3 IMPLANT
BANDAGE ESMARK 6X9 LF (GAUZE/BANDAGES/DRESSINGS) IMPLANT
BIT DRILL 2.5X110 QC LCP DISP (BIT) ×3 IMPLANT
BLADE SURG 15 STRL LF DISP TIS (BLADE) ×2 IMPLANT
BLADE SURG 15 STRL SS (BLADE) ×4
BNDG COHESIVE 4X5 TAN STRL (GAUZE/BANDAGES/DRESSINGS) ×3 IMPLANT
BNDG ESMARK 4X9 LF (GAUZE/BANDAGES/DRESSINGS) IMPLANT
BNDG ESMARK 6X9 LF (GAUZE/BANDAGES/DRESSINGS)
BNDG GAUZE ELAST 4 BULKY (GAUZE/BANDAGES/DRESSINGS) ×3 IMPLANT
CANISTER SUCT 1200ML W/VALVE (MISCELLANEOUS) IMPLANT
CLOSURE WOUND 1/2 X4 (GAUZE/BANDAGES/DRESSINGS) ×1
COVER BACK TABLE REUSABLE LG (DRAPES) ×3 IMPLANT
COVER WAND RF STERILE (DRAPES) IMPLANT
CUFF TOURN SGL QUICK 34 (TOURNIQUET CUFF)
CUFF TRNQT CYL 34X4.125X (TOURNIQUET CUFF) IMPLANT
DECANTER SPIKE VIAL GLASS SM (MISCELLANEOUS) IMPLANT
DRAPE EXTREMITY T 121X128X90 (DISPOSABLE) ×3 IMPLANT
DRAPE IMP U-DRAPE 54X76 (DRAPES) ×3 IMPLANT
DRAPE OEC MINIVIEW 54X84 (DRAPES) ×3 IMPLANT
DRAPE U-SHAPE 47X51 STRL (DRAPES) ×3 IMPLANT
DRSG PAD ABDOMINAL 8X10 ST (GAUZE/BANDAGES/DRESSINGS) ×6 IMPLANT
DURAPREP 26ML APPLICATOR (WOUND CARE) ×3 IMPLANT
ELECT REM PT RETURN 9FT ADLT (ELECTROSURGICAL) ×3
ELECTRODE REM PT RTRN 9FT ADLT (ELECTROSURGICAL) ×1 IMPLANT
GAUZE SPONGE 4X4 12PLY STRL (GAUZE/BANDAGES/DRESSINGS) ×3 IMPLANT
GAUZE XEROFORM 1X8 LF (GAUZE/BANDAGES/DRESSINGS) ×3 IMPLANT
GLOVE BIO SURGEON STRL SZ7 (GLOVE) ×3 IMPLANT
GLOVE BIOGEL PI IND STRL 7.0 (GLOVE) ×1 IMPLANT
GLOVE BIOGEL PI IND STRL 7.5 (GLOVE) ×1 IMPLANT
GLOVE BIOGEL PI IND STRL 8 (GLOVE) ×1 IMPLANT
GLOVE BIOGEL PI INDICATOR 7.0 (GLOVE) ×2
GLOVE BIOGEL PI INDICATOR 7.5 (GLOVE) ×2
GLOVE BIOGEL PI INDICATOR 8 (GLOVE) ×2
GLOVE SS BIOGEL STRL SZ 7.5 (GLOVE) ×1 IMPLANT
GLOVE SUPERSENSE BIOGEL SZ 7.5 (GLOVE) ×2
GOWN STRL REUS W/ TWL LRG LVL3 (GOWN DISPOSABLE) ×2 IMPLANT
GOWN STRL REUS W/ TWL XL LVL3 (GOWN DISPOSABLE) ×1 IMPLANT
GOWN STRL REUS W/TWL LRG LVL3 (GOWN DISPOSABLE) ×4
GOWN STRL REUS W/TWL XL LVL3 (GOWN DISPOSABLE) ×2
KIT 1/3 TUB PL 5H 61M (Orthopedic Implant) ×1 IMPLANT
NEEDLE 1/2 CIR CATGUT .05X1.09 (NEEDLE) IMPLANT
NEEDLE HYPO 22GX1.5 SAFETY (NEEDLE) IMPLANT
NEEDLE HYPO 25X1 1.5 SAFETY (NEEDLE) IMPLANT
NEEDLE MAYO TROCAR (NEEDLE) IMPLANT
PACK BASIN DAY SURGERY FS (CUSTOM PROCEDURE TRAY) ×3 IMPLANT
PAD CAST 3X4 CTTN HI CHSV (CAST SUPPLIES) IMPLANT
PAD CAST 4YDX4 CTTN HI CHSV (CAST SUPPLIES) IMPLANT
PADDING CAST ABS 4INX4YD NS (CAST SUPPLIES) ×4
PADDING CAST ABS COTTON 4X4 ST (CAST SUPPLIES) ×2 IMPLANT
PADDING CAST COTTON 3X4 STRL (CAST SUPPLIES)
PADDING CAST COTTON 4X4 STRL (CAST SUPPLIES)
PENCIL BUTTON HOLSTER BLD 10FT (ELECTRODE) ×3 IMPLANT
PROS 1/3 TUB PL 5H 61M (Orthopedic Implant) ×3 IMPLANT
SCREW CANC FT ST SFS 4X14 (Screw) ×3 IMPLANT
SCREW CANC FT/18 4.0 (Screw) ×6 IMPLANT
SCREW CORTEX 3.5 12MM (Screw) ×2 IMPLANT
SCREW CORTEX 3.5 24MM (Screw) ×2 IMPLANT
SCREW LOCK CORT ST 3.5X12 (Screw) ×1 IMPLANT
SCREW LOCK CORT ST 3.5X24 (Screw) ×1 IMPLANT
SLEEVE SCD COMPRESS KNEE MED (MISCELLANEOUS) IMPLANT
SPLINT FAST PLASTER 5X30 (CAST SUPPLIES) ×30
SPLINT PLASTER CAST FAST 5X30 (CAST SUPPLIES) ×15 IMPLANT
SPONGE LAP 4X18 RFD (DISPOSABLE) ×3 IMPLANT
STAPLER VISISTAT 35W (STAPLE) IMPLANT
STOCKINETTE 6  STRL (DRAPES) ×2
STOCKINETTE 6 STRL (DRAPES) ×1 IMPLANT
STRIP CLOSURE SKIN 1/2X4 (GAUZE/BANDAGES/DRESSINGS) ×2 IMPLANT
SUCTION FRAZIER HANDLE 10FR (MISCELLANEOUS)
SUCTION TUBE FRAZIER 10FR DISP (MISCELLANEOUS) IMPLANT
SUT ETHILON 4 0 PS 2 18 (SUTURE) IMPLANT
SUT MNCRL AB 3-0 PS2 18 (SUTURE) ×3 IMPLANT
SUT PROLENE 3 0 PS 2 (SUTURE) IMPLANT
SUT VIC AB 0 CT1 27 (SUTURE) ×2
SUT VIC AB 0 CT1 27XBRD ANBCTR (SUTURE) ×1 IMPLANT
SUT VIC AB 2-0 SH 27 (SUTURE) ×2
SUT VIC AB 2-0 SH 27XBRD (SUTURE) ×1 IMPLANT
SUT VIC AB 3-0 FS2 27 (SUTURE) IMPLANT
SUT VICRYL 4-0 PS2 18IN ABS (SUTURE) IMPLANT
SYR BULB 3OZ (MISCELLANEOUS) ×3 IMPLANT
SYR CONTROL 10ML LL (SYRINGE) IMPLANT
TOWEL GREEN STERILE FF (TOWEL DISPOSABLE) ×6 IMPLANT
TUBE CONNECTING 20'X1/4 (TUBING) ×1
TUBE CONNECTING 20X1/4 (TUBING) ×2 IMPLANT

## 2018-10-22 NOTE — Transfer of Care (Signed)
Immediate Anesthesia Transfer of Care Note  Patient: Geoffrey Perez  Procedure(s) Performed: OPEN REDUCTION INTERNAL FIXATION (ORIF) DISTAL FIBULA FRACTURE (Left Ankle)  Patient Location: PACU  Anesthesia Type:General and Regional  Level of Consciousness: awake, alert  and oriented  Airway & Oxygen Therapy: Patient Spontanous Breathing and Patient connected to face mask oxygen  Post-op Assessment: Report given to RN and Post -op Vital signs reviewed and stable  Post vital signs: Reviewed and stable  Last Vitals:  Vitals Value Taken Time  BP    Temp    Pulse 103 10/22/2018  8:42 AM  Resp    SpO2 100 % 10/22/2018  8:42 AM  Vitals shown include unvalidated device data.  Last Pain:  Vitals:   10/22/18 0652  TempSrc: Oral  PainSc: 0-No pain         Complications: No apparent anesthesia complications

## 2018-10-22 NOTE — Anesthesia Procedure Notes (Signed)
Procedure Name: LMA Insertion Performed by: Eldra Word M, CRNA Pre-anesthesia Checklist: Patient identified, Emergency Drugs available, Suction available, Patient being monitored and Timeout performed Patient Re-evaluated:Patient Re-evaluated prior to induction Oxygen Delivery Method: Circle system utilized Preoxygenation: Pre-oxygenation with 100% oxygen Induction Type: IV induction LMA: LMA inserted LMA Size: 4.0 Number of attempts: 1 Placement Confirmation: positive ETCO2,  CO2 detector and breath sounds checked- equal and bilateral Tube secured with: Tape Dental Injury: Teeth and Oropharynx as per pre-operative assessment        

## 2018-10-22 NOTE — Anesthesia Preprocedure Evaluation (Signed)
Anesthesia Evaluation  Patient identified by MRN, date of birth, ID band Patient awake    Reviewed: Allergy & Precautions, NPO status , Patient's Chart, lab work & pertinent test results  History of Anesthesia Complications (+) PONV and history of anesthetic complications  Airway Mallampati: II  TM Distance: >3 FB Neck ROM: Full    Dental  (+) Teeth Intact,    Pulmonary neg recent URI, former smoker,    breath sounds clear to auscultation       Cardiovascular negative cardio ROS   Rhythm:Regular     Neuro/Psych negative neurological ROS  negative psych ROS   GI/Hepatic negative GI ROS, Neg liver ROS,   Endo/Other  negative endocrine ROS  Renal/GU negative Renal ROS     Musculoskeletal  (+) Arthritis , LEFT ANKLE FRACTURE   Abdominal   Peds  Hematology negative hematology ROS (+)   Anesthesia Other Findings   Reproductive/Obstetrics                             Anesthesia Physical Anesthesia Plan  ASA: I  Anesthesia Plan: General and Regional   Post-op Pain Management:    Induction: Intravenous  PONV Risk Score and Plan: 3 and Ondansetron and Dexamethasone  Airway Management Planned: LMA  Additional Equipment: None  Intra-op Plan:   Post-operative Plan: Extubation in OR  Informed Consent: I have reviewed the patients History and Physical, chart, labs and discussed the procedure including the risks, benefits and alternatives for the proposed anesthesia with the patient or authorized representative who has indicated his/her understanding and acceptance.     Dental advisory given  Plan Discussed with: CRNA and Surgeon  Anesthesia Plan Comments:         Anesthesia Quick Evaluation

## 2018-10-22 NOTE — Anesthesia Procedure Notes (Signed)
Anesthesia Regional Block: Popliteal block   Pre-Anesthetic Checklist: ,, timeout performed, Correct Patient, Correct Site, Correct Laterality, Correct Procedure, Correct Position, site marked, Risks and benefits discussed,  Surgical consent,  Pre-op evaluation,  At surgeon's request and post-op pain management  Laterality: Left and Lower  Prep: chloraprep       Needles:  Injection technique: Single-shot     Needle Length: 9cm  Needle Gauge: 22     Additional Needles: Arrow StimuQuik ECHO Echogenic Stimulating PNB Needle  Procedures:,,,, ultrasound used (permanent image in chart),,,,  Narrative:  Start time: 10/22/2018 7:22 AM End time: 10/22/2018 7:27 AM Injection made incrementally with aspirations every 5 mL.  Performed by: Personally  Anesthesiologist: Val Eagle, MD

## 2018-10-22 NOTE — Discharge Instructions (Signed)

## 2018-10-22 NOTE — Progress Notes (Signed)
Assisted Dr. Moser with left, ultrasound guided, popliteal block. Side rails up, monitors on throughout procedure. See vital signs in flow sheet. Tolerated Procedure well. 

## 2018-10-22 NOTE — Interval H&P Note (Signed)
History and Physical Interval Note:  10/22/2018 6:40 AM  Geoffrey Perez  has presented today for surgery, with the diagnosis of LEFT ANKLE FRACTURE.  The various methods of treatment have been discussed with the patient and family. After consideration of risks, benefits and other options for treatment, the patient has consented to  Procedure(s): OPEN REDUCTION INTERNAL FIXATION (ORIF) DISTAL FIBULA FRACTURE (Left) as a surgical intervention.  The patient's history has been reviewed, patient examined, no change in status, stable for surgery.  I have reviewed the patient's chart and labs.  Questions were answered to the patient's satisfaction.     Nilda Simmer
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
## 2018-10-23 ENCOUNTER — Encounter (HOSPITAL_BASED_OUTPATIENT_CLINIC_OR_DEPARTMENT_OTHER): Payer: Self-pay | Admitting: Orthopedic Surgery

## 2018-10-23 NOTE — Anesthesia Postprocedure Evaluation (Signed)
Anesthesia Post Note  Patient: Geoffrey Perez  Procedure(s) Performed: OPEN REDUCTION INTERNAL FIXATION (ORIF) DISTAL FIBULA FRACTURE (Left Ankle)     Patient location during evaluation: PACU Anesthesia Type: Regional and General Level of consciousness: awake and alert Pain management: pain level controlled Vital Signs Assessment: post-procedure vital signs reviewed and stable Respiratory status: spontaneous breathing, nonlabored ventilation, respiratory function stable and patient connected to nasal cannula oxygen Cardiovascular status: blood pressure returned to baseline and stable Postop Assessment: no apparent nausea or vomiting Anesthetic complications: no    Last Vitals:  Vitals:   10/22/18 0911 10/22/18 0930  BP: 126/82 130/86  Pulse: 85 92  Resp: 14 20  Temp:  36.8 C  SpO2: 99% 98%    Last Pain:  Vitals:   10/22/18 0930  TempSrc: Oral                 Jackye Dever
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
## 2023-08-01 DIAGNOSIS — Z13220 Encounter for screening for lipoid disorders: Secondary | ICD-10-CM | POA: Diagnosis not present

## 2023-08-01 DIAGNOSIS — Z1389 Encounter for screening for other disorder: Secondary | ICD-10-CM | POA: Diagnosis not present

## 2023-08-01 DIAGNOSIS — Z0000 Encounter for general adult medical examination without abnormal findings: Secondary | ICD-10-CM | POA: Diagnosis not present

## 2023-08-01 DIAGNOSIS — Z125 Encounter for screening for malignant neoplasm of prostate: Secondary | ICD-10-CM | POA: Diagnosis not present

## 2023-11-03 DIAGNOSIS — Z1211 Encounter for screening for malignant neoplasm of colon: Secondary | ICD-10-CM | POA: Diagnosis not present

## 2023-11-03 DIAGNOSIS — K5730 Diverticulosis of large intestine without perforation or abscess without bleeding: Secondary | ICD-10-CM | POA: Diagnosis not present

## 2023-11-03 DIAGNOSIS — K635 Polyp of colon: Secondary | ICD-10-CM | POA: Diagnosis not present
